# Patient Record
Sex: Male | Born: 1950 | Race: Black or African American | Hispanic: No | Marital: Single
Health system: Southern US, Community
[De-identification: ages and names within clinical notes are randomized; demographics above are authoritative.]

---

## 2000-01-21 ENCOUNTER — Ambulatory Visit (HOSPITAL_COMMUNITY): Admission: RE | Admit: 2000-01-21 | Discharge: 2000-01-21 | Payer: Self-pay | Admitting: Internal Medicine

## 2000-01-21 ENCOUNTER — Encounter: Payer: Self-pay | Admitting: Internal Medicine

## 2000-05-01 ENCOUNTER — Ambulatory Visit (HOSPITAL_COMMUNITY): Admission: RE | Admit: 2000-05-01 | Discharge: 2000-05-01 | Payer: Self-pay | Admitting: Neurosurgery

## 2000-05-01 ENCOUNTER — Encounter: Payer: Self-pay | Admitting: Family Medicine

## 2000-10-20 ENCOUNTER — Ambulatory Visit (HOSPITAL_COMMUNITY): Admission: RE | Admit: 2000-10-20 | Discharge: 2000-10-20 | Payer: Self-pay | Admitting: Family Medicine

## 2000-10-20 ENCOUNTER — Encounter: Payer: Self-pay | Admitting: Family Medicine

## 2001-10-26 ENCOUNTER — Encounter: Payer: Self-pay | Admitting: Emergency Medicine

## 2001-10-26 ENCOUNTER — Emergency Department (HOSPITAL_COMMUNITY): Admission: EM | Admit: 2001-10-26 | Discharge: 2001-10-26 | Payer: Self-pay | Admitting: Emergency Medicine

## 2003-06-30 ENCOUNTER — Ambulatory Visit (HOSPITAL_COMMUNITY): Admission: RE | Admit: 2003-06-30 | Discharge: 2003-06-30 | Payer: Self-pay | Admitting: Internal Medicine

## 2003-07-31 ENCOUNTER — Ambulatory Visit (HOSPITAL_COMMUNITY): Admission: RE | Admit: 2003-07-31 | Discharge: 2003-07-31 | Payer: Self-pay | Admitting: Internal Medicine

## 2003-11-17 ENCOUNTER — Ambulatory Visit: Payer: Self-pay | Admitting: *Deleted

## 2004-03-07 ENCOUNTER — Emergency Department (HOSPITAL_COMMUNITY): Admission: EM | Admit: 2004-03-07 | Discharge: 2004-03-08 | Payer: Self-pay | Admitting: Emergency Medicine

## 2004-03-13 ENCOUNTER — Ambulatory Visit (HOSPITAL_COMMUNITY): Admission: RE | Admit: 2004-03-13 | Discharge: 2004-03-13 | Payer: Self-pay | Admitting: Cardiology

## 2004-03-26 ENCOUNTER — Inpatient Hospital Stay (HOSPITAL_BASED_OUTPATIENT_CLINIC_OR_DEPARTMENT_OTHER): Admission: RE | Admit: 2004-03-26 | Discharge: 2004-03-26 | Payer: Self-pay | Admitting: Cardiology

## 2004-04-10 ENCOUNTER — Encounter: Admission: RE | Admit: 2004-04-10 | Discharge: 2004-04-10 | Payer: Self-pay | Admitting: Cardiology

## 2004-05-20 ENCOUNTER — Ambulatory Visit: Payer: Self-pay | Admitting: Nurse Practitioner

## 2004-06-10 ENCOUNTER — Ambulatory Visit: Payer: Self-pay | Admitting: Nurse Practitioner

## 2004-06-12 ENCOUNTER — Ambulatory Visit: Payer: Self-pay | Admitting: Nurse Practitioner

## 2004-07-05 ENCOUNTER — Ambulatory Visit: Payer: Self-pay | Admitting: Nurse Practitioner

## 2004-08-30 ENCOUNTER — Ambulatory Visit: Payer: Self-pay | Admitting: Family Medicine

## 2004-12-02 ENCOUNTER — Ambulatory Visit: Payer: Self-pay | Admitting: Nurse Practitioner

## 2005-02-18 ENCOUNTER — Encounter (INDEPENDENT_AMBULATORY_CARE_PROVIDER_SITE_OTHER): Payer: Self-pay | Admitting: Nurse Practitioner

## 2005-03-03 ENCOUNTER — Ambulatory Visit: Payer: Self-pay | Admitting: Nurse Practitioner

## 2005-04-17 ENCOUNTER — Ambulatory Visit: Payer: Self-pay | Admitting: Nurse Practitioner

## 2005-05-20 ENCOUNTER — Ambulatory Visit: Payer: Self-pay | Admitting: Nurse Practitioner

## 2005-06-01 IMAGING — RF DG BE W/ CM - WO/W KUB
10 series · 10 of 10 positions shown · non-contrast
Comparison: none

CLINICAL DATA: persistent lower abdominal pain with bloating with element of constipation
BARIUM ENEMA
An air-contrast enema was considered however the patient stated that he did not have complete clearing with the preparation and the plain film of the abdomen reveals scattered stool in the colon.  For this reason it was felt advisable to proceed with a single-column study.  The abdomen reveals intestinal gas pattern overall to be within normal limits.  There does appear to be scattered stool in the colon.  Note is made of a sclerotic somewhat rounded density in the left innominate bone just inferior to the iliac crest thought to represent a bone island.  Note is also made of irregular cortical defect in the right femur involving the intertrochanteric region thought most likely to represent either fibrous dysplasia or nonossifying fibroma.
Through a balloon rectal catheter the barium was allowed to flow in a retrograde fashion throughout the colon refluxed into the small bowel.  There was some encountered stool noted however no evidence of obstruction of the colon is noted.  There is moderate redundancy of the colon noted.  Several diverticula are noted scattered throughout the colon including the ascending colon.  The appendix is identified.  There is reflux into the small bowel which appears normal.  Postevacuation film reveals no additional findings with most of the barium having been expelled.  
IMPRESSION
Several diverticula are noted without evidence of diverticulitis or other acute abnormality of the colon.  There is redundancy of the colon noted.  Moderate amount of stool was encountered.  No annular lesions can be identified however due to the factors noted above optimal evaluation for polyps could not be made.

[Series 1: run · 1 of 1 slices shown (1 of 10)]
[im 1/1]
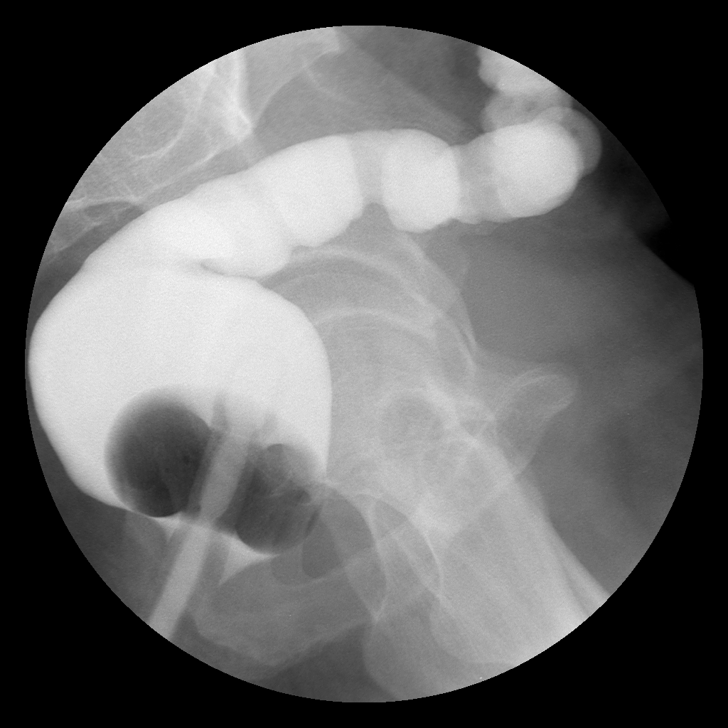

[Series 2: run · 1 of 1 slices shown (2 of 10)]
[im 1/1]
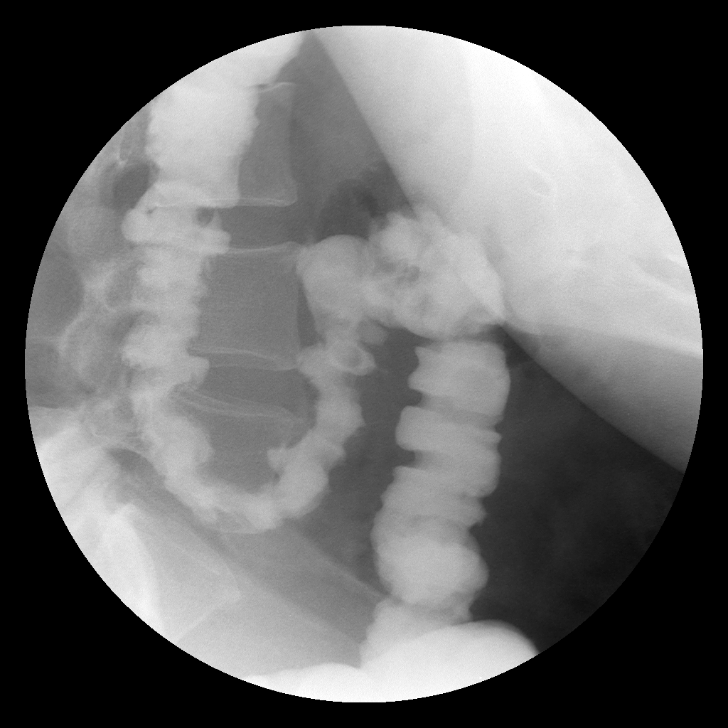

[Series 3: run · 1 of 1 slices shown (3 of 10)]
[im 1/1]
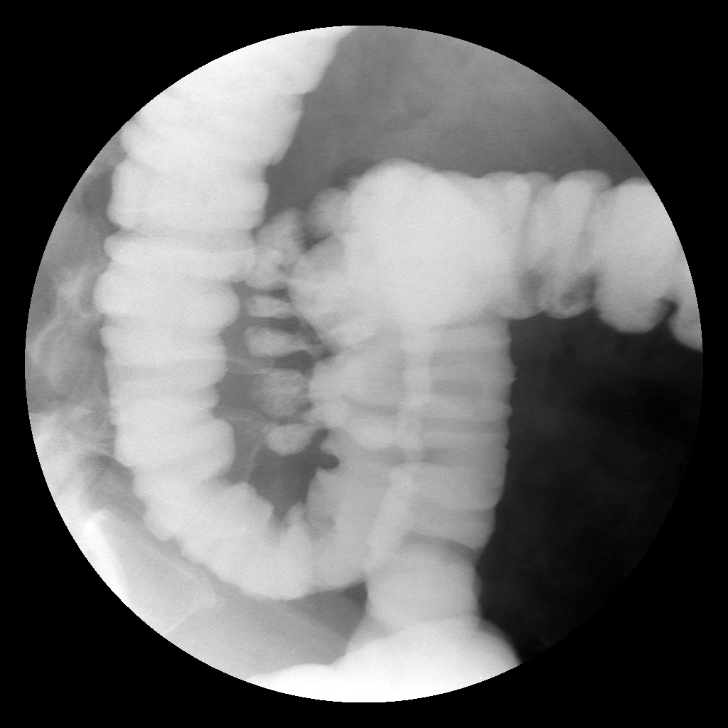

[Series 4: run · 1 of 1 slices shown (4 of 10)]
[im 1/1]
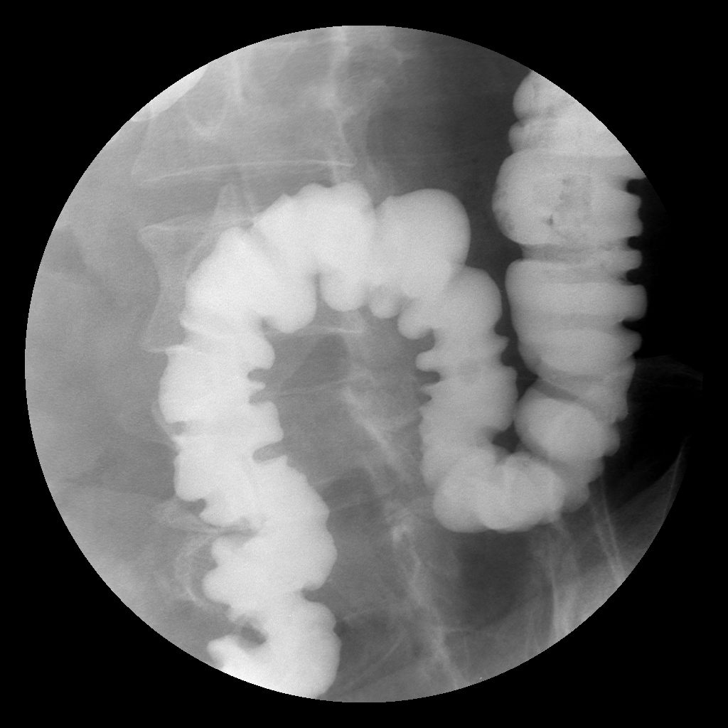

[Series 5: run · 1 of 1 slices shown (5 of 10)]
[im 1/1]
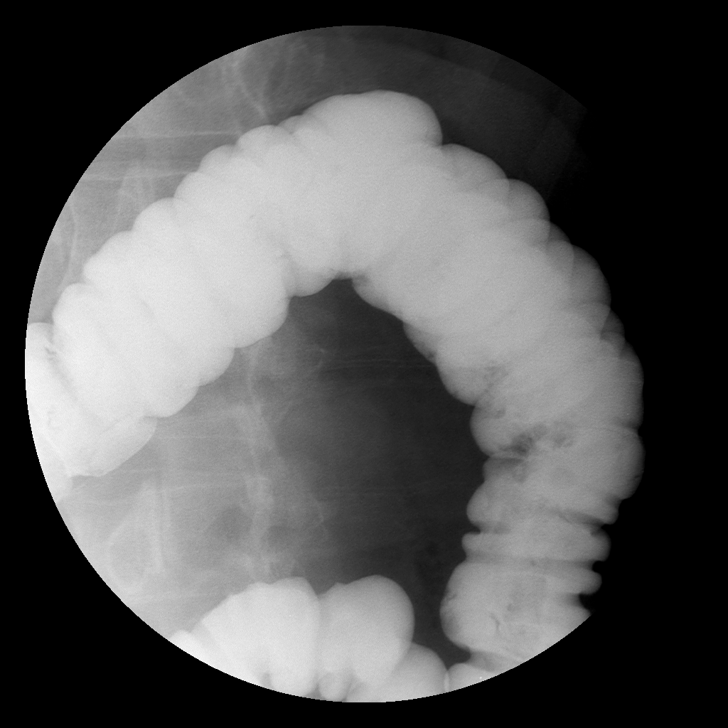

[Series 6: run · 1 of 1 slices shown (6 of 10)]
[im 1/1]
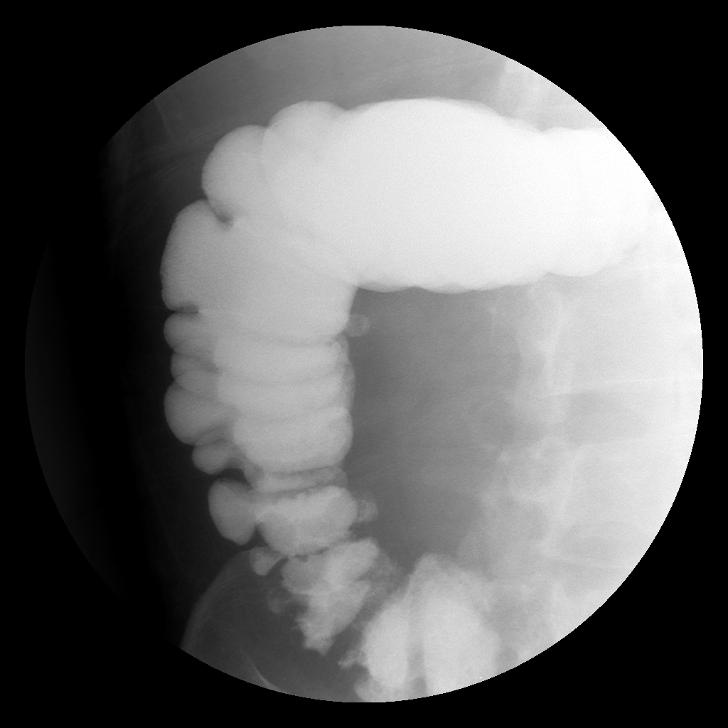

[Series 7: run · 1 of 1 slices shown (7 of 10)]
[im 1/1]
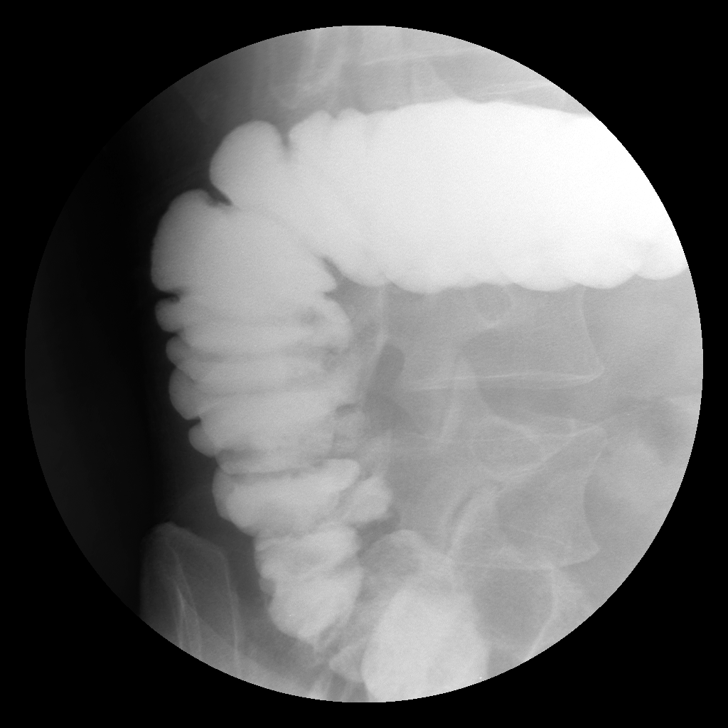

[Series 8: run · 1 of 1 slices shown (8 of 10)]
[im 1/1]
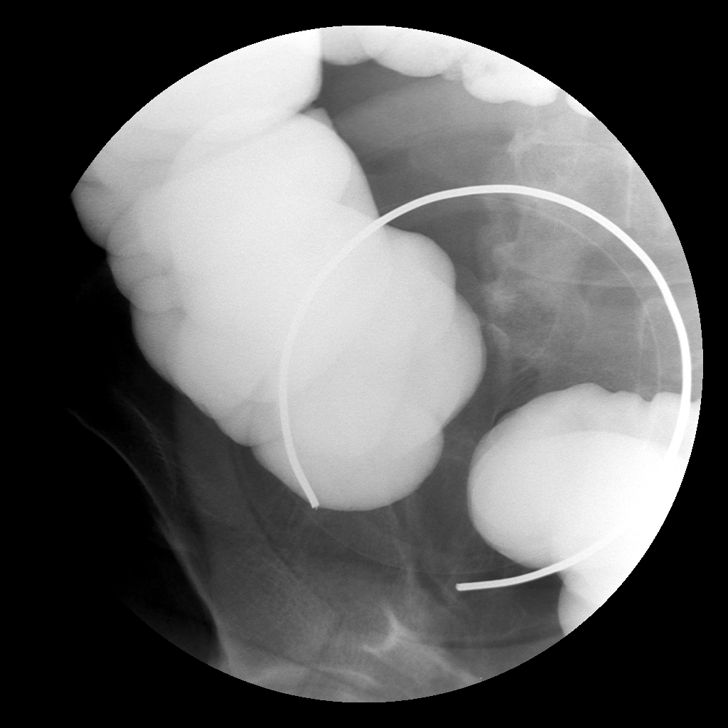

[Series 9: run · 1 of 1 slices shown (9 of 10)]
[im 1/1]
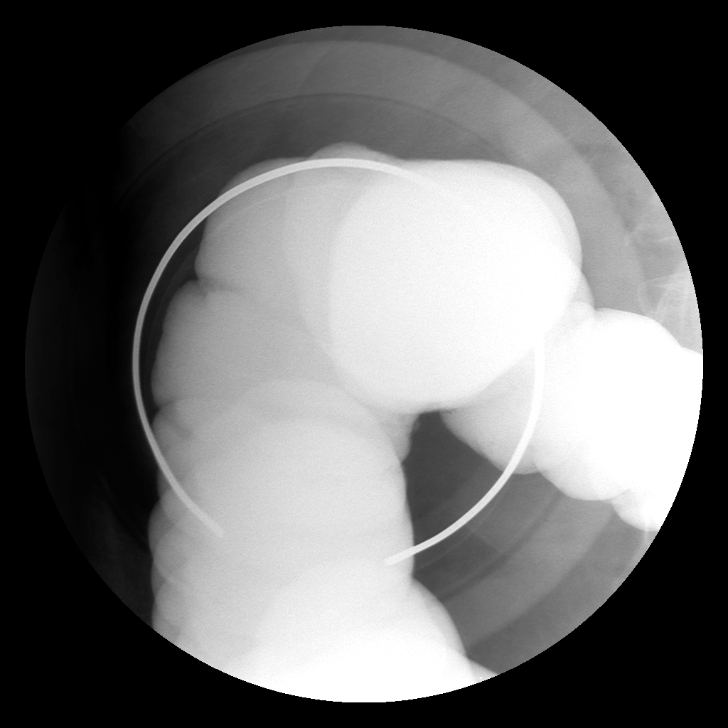

[Series 10: run · 1 of 1 slices shown (10 of 10)]
[im 1/1]
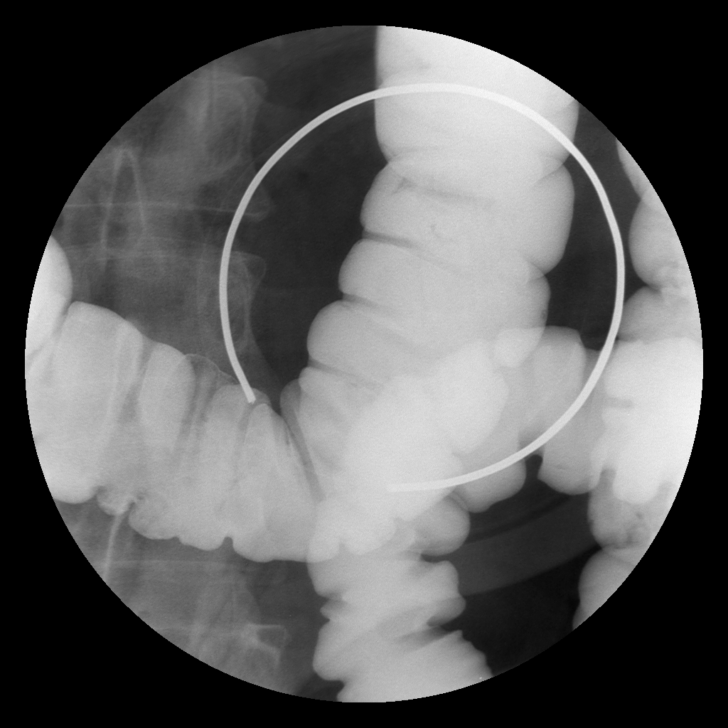

[10 of 10 positions shown; findings below may reference images not displayed]

## 2005-06-16 ENCOUNTER — Ambulatory Visit: Payer: Self-pay | Admitting: Internal Medicine

## 2005-06-23 ENCOUNTER — Ambulatory Visit: Payer: Self-pay | Admitting: Nurse Practitioner

## 2005-07-03 ENCOUNTER — Ambulatory Visit: Payer: Self-pay | Admitting: Nurse Practitioner

## 2005-07-08 ENCOUNTER — Ambulatory Visit: Payer: Self-pay | Admitting: Nurse Practitioner

## 2005-08-08 ENCOUNTER — Ambulatory Visit: Payer: Self-pay | Admitting: Internal Medicine

## 2005-08-22 ENCOUNTER — Ambulatory Visit: Payer: Self-pay | Admitting: Nurse Practitioner

## 2005-09-01 ENCOUNTER — Ambulatory Visit: Payer: Self-pay | Admitting: Nurse Practitioner

## 2005-09-09 ENCOUNTER — Ambulatory Visit: Payer: Self-pay | Admitting: Nurse Practitioner

## 2005-10-09 ENCOUNTER — Ambulatory Visit: Payer: Self-pay | Admitting: Internal Medicine

## 2005-10-13 ENCOUNTER — Ambulatory Visit: Payer: Self-pay | Admitting: Internal Medicine

## 2005-10-17 ENCOUNTER — Ambulatory Visit: Payer: Self-pay | Admitting: Internal Medicine

## 2005-10-23 ENCOUNTER — Ambulatory Visit: Payer: Self-pay | Admitting: Nurse Practitioner

## 2005-11-24 ENCOUNTER — Ambulatory Visit: Payer: Self-pay | Admitting: Nurse Practitioner

## 2005-12-15 ENCOUNTER — Ambulatory Visit: Payer: Self-pay | Admitting: Nurse Practitioner

## 2005-12-26 ENCOUNTER — Ambulatory Visit: Payer: Self-pay | Admitting: Internal Medicine

## 2006-01-30 ENCOUNTER — Ambulatory Visit: Payer: Self-pay | Admitting: Internal Medicine

## 2006-02-07 IMAGING — CR DG CHEST 1V PORT
1 series · 1 of 1 positions shown · non-contrast
Comparison: None

CLINICAL DATA: Chest pain

PORTABLE CHEST - 1 VIEW:

[view not recorded]
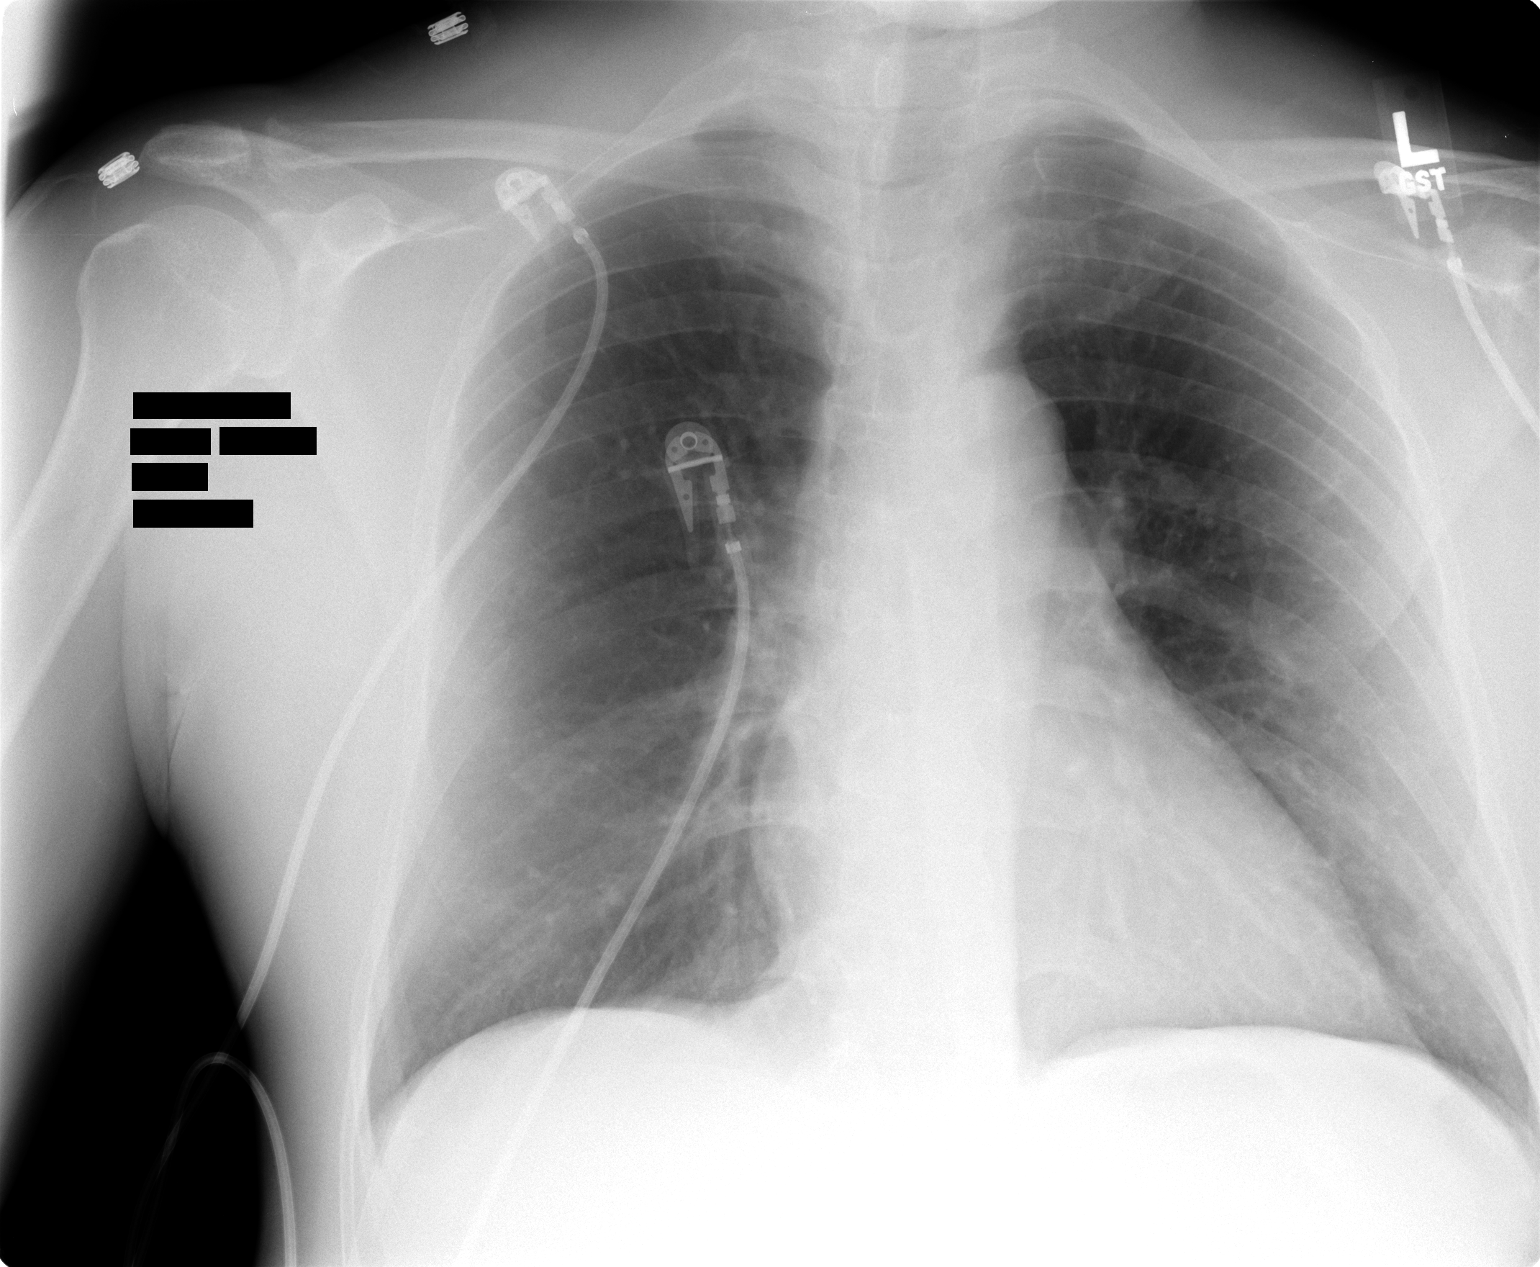

[1 of 1 positions shown; findings below may reference images not displayed]

FINDINGS: The heart size and mediastinal contours are within normal limits. 
Both lungs are clear.
IMPRESSION: No acute disease.

## 2006-02-18 ENCOUNTER — Ambulatory Visit: Payer: Self-pay | Admitting: Nurse Practitioner

## 2006-03-11 ENCOUNTER — Ambulatory Visit: Payer: Self-pay | Admitting: Nurse Practitioner

## 2006-04-08 ENCOUNTER — Ambulatory Visit (HOSPITAL_COMMUNITY): Admission: RE | Admit: 2006-04-08 | Discharge: 2006-04-08 | Payer: Self-pay | Admitting: Nurse Practitioner

## 2006-04-08 ENCOUNTER — Ambulatory Visit: Payer: Self-pay | Admitting: Nurse Practitioner

## 2006-05-25 ENCOUNTER — Ambulatory Visit (HOSPITAL_COMMUNITY): Admission: RE | Admit: 2006-05-25 | Discharge: 2006-05-25 | Payer: Self-pay | Admitting: Internal Medicine

## 2006-05-25 ENCOUNTER — Ambulatory Visit: Payer: Self-pay | Admitting: Internal Medicine

## 2006-06-10 ENCOUNTER — Ambulatory Visit: Payer: Self-pay | Admitting: Nurse Practitioner

## 2006-07-17 ENCOUNTER — Ambulatory Visit: Payer: Self-pay | Admitting: Internal Medicine

## 2006-08-06 ENCOUNTER — Encounter (INDEPENDENT_AMBULATORY_CARE_PROVIDER_SITE_OTHER): Payer: Self-pay | Admitting: Nurse Practitioner

## 2006-08-06 DIAGNOSIS — F329 Major depressive disorder, single episode, unspecified: Secondary | ICD-10-CM | POA: Insufficient documentation

## 2006-08-06 DIAGNOSIS — I10 Essential (primary) hypertension: Secondary | ICD-10-CM | POA: Insufficient documentation

## 2006-08-06 DIAGNOSIS — L408 Other psoriasis: Secondary | ICD-10-CM

## 2006-08-06 DIAGNOSIS — E7800 Pure hypercholesterolemia, unspecified: Secondary | ICD-10-CM

## 2006-08-06 DIAGNOSIS — E119 Type 2 diabetes mellitus without complications: Secondary | ICD-10-CM | POA: Insufficient documentation

## 2006-08-12 ENCOUNTER — Ambulatory Visit: Payer: Self-pay | Admitting: Internal Medicine

## 2006-09-23 ENCOUNTER — Ambulatory Visit: Payer: Self-pay | Admitting: Internal Medicine

## 2006-10-16 ENCOUNTER — Ambulatory Visit: Payer: Self-pay | Admitting: Family Medicine

## 2006-11-04 ENCOUNTER — Encounter (INDEPENDENT_AMBULATORY_CARE_PROVIDER_SITE_OTHER): Payer: Self-pay | Admitting: *Deleted

## 2006-12-22 ENCOUNTER — Ambulatory Visit: Payer: Self-pay | Admitting: Internal Medicine

## 2010-07-05 NOTE — Cardiovascular Report (Signed)
NAMEJAYTHAN, HINELY                 ACCOUNT NO.:  [id number]   MEDICAL RECORD NO.:  [id number]          PATIENT TYPE:  OIB   LOCATION:  NA                           FACILITY:  [facility]   PHYSICIAN:  Mohan N. Sharyn Lull, M.D. DATE OF BIRTH:  [birth year]   DATE OF PROCEDURE:  03/26/2004  DATE OF DISCHARGE:                              CARDIAC CATHETERIZATION   PROCEDURE:  1.  Left cardiac catheterization.  2.  Selective left and right coronary angiography.  3.  Left ventriculography via right groin using Judkins technique.   INDICATIONS FOR PROCEDURE:  Mr. Ringel is a 60 year old black male with past  medical history significant for hypertension, non-insulin-dependent diabetes  mellitus, hypercholesteremia. Complains of retrosternal chest tightness  described as squeezing pain lasting a few minutes without any associated  symptoms of nausea, vomiting, or diaphoresis. Denies any PND, orthopnea, leg  swelling. Denies palpitations, lightheadedness, or syncope. Patient denies  any rest or nocturnal angina. Patient underwent stress Myoview on January 25  which showed small area of reversible perfusion defect in the distal  anteroseptal wall with EF of 46%. Due to multiple risk factors and chest  pain and mildly positive stress Myoview, discussed with the patient  regarding left catheterization, its risks and benefits, and consented for  the procedure.   PROCEDURE:  After obtaining the informed consent patient was brought to the  catheterization laboratory and was placed on fluoroscopy table. Right groin  was prepped and draped in usual fashion. 2% Xylocaine was used for local  anesthesia in the right right groin. With the help of thin-wall needle a 4-  French arterial sheath was placed. The sheath was aspirated and flushed.  Next, 4-French left Judkins catheter was advanced over the wire under  fluoroscopic guidance up to the ascending aorta. Wire was pulled out. The  catheter was aspirated and  connected to the manifold. Catheter was further  advanced and engaged into left coronary ostium. Multiple views of the left  system were taken. Next, the catheter was disengaged and was pulled out over  the wire and was replaced with 4-French right Judkins catheter which was  advanced over the wire under fluoroscopic guidance up to the ascending  aorta. Wire was pulled out. The catheter was aspirated and connected to the  manifold. Catheter was further advanced and engaged into right coronary  ostium. Multiple views of the right system were taken. Next, the catheter  was disengaged and was pulled out over the wire and was replaced with 4-  [place] pigtail catheter which was advanced over the wire under fluoroscopic  guidance up to the ascending aorta. Wire was pulled out. The catheter was  aspirated and connected to the manifold. Catheter was further advanced  across the aortic valve into the LV. LV pressures were recorded. Next LV  graphy was done in 30 degree RAO position. Post angiographic pressures were  recorded from LV and then pullback pressures were recorded from the aorta.  There was no significant gradient across aortic valve. Next, the pigtail  catheter was pulled out over the wire. Sheaths aspirated and flushed.  FINDINGS:  LV showed good LV systolic function, EF of 50-55% which appears  to be more than 46% as per stress test. Left main was patent. LAD has 10%  proximal stenosis. Diagonal 1 was moderate size which was patent. Diagonal 2  was small which was patent. Ramus was small which was patent. Left  circumflex was a large which was  patent. OM1 to OM3 were very, very small. They were less than 0.5 mm. OM4  was small which was patent. OM5 has 30% ostial stenosis. PDA was patent. RCA  was nondominant which was patent. Patient tolerated procedure well. There  are no complications. The patient was transferred to recovery room in stable  condition      MNH/MEDQ  D:   03/26/2004  T:  03/26/2004  Job:  [id number]

## 2011-09-20 ENCOUNTER — Other Ambulatory Visit: Payer: Self-pay | Admitting: Cardiology

## 2011-09-21 ENCOUNTER — Other Ambulatory Visit: Payer: Self-pay | Admitting: Cardiology

## 2020-08-06 ENCOUNTER — Telehealth: Payer: Self-pay

## 2020-08-06 NOTE — Telephone Encounter (Signed)
Patient called into the [facility] regarding medication refill on Metformin. Patient states that he has been trying to reach office since Thursday of last week. Patient states that he is suppose to be doing 1500mg  of the Metformin. Please advise patient on refill and would like medication [facility] at [address] [place], [place]. Patient only has about 2 days of medication.
